# Patient Record
Sex: Female | Born: 1960 | Race: White | Hispanic: No | Marital: Married | Smoking: Never smoker
Health system: Southern US, Community
[De-identification: ages and names within clinical notes are randomized; demographics above are authoritative.]

## PROBLEM LIST (undated history)

## (undated) DIAGNOSIS — M1990 Unspecified osteoarthritis, unspecified site: Secondary | ICD-10-CM

## (undated) DIAGNOSIS — E039 Hypothyroidism, unspecified: Secondary | ICD-10-CM

## (undated) DIAGNOSIS — K709 Alcoholic liver disease, unspecified: Secondary | ICD-10-CM

## (undated) DIAGNOSIS — J45909 Unspecified asthma, uncomplicated: Secondary | ICD-10-CM

## (undated) HISTORY — PX: WRIST SURGERY: SHX841

## (undated) HISTORY — PX: APPENDECTOMY: SHX54

---

## 2013-06-09 HISTORY — PX: KNEE SURGERY: SHX244

## 2014-12-22 ENCOUNTER — Inpatient Hospital Stay (HOSPITAL_COMMUNITY)
Admission: EM | Admit: 2014-12-22 | Discharge: 2014-12-23 | DRG: 378 | Disposition: A | Discharge status: Rehabilitation Facility | Payer: BLUE CROSS/BLUE SHIELD | Attending: Internal Medicine | Admitting: Internal Medicine

## 2014-12-22 ENCOUNTER — Encounter (HOSPITAL_COMMUNITY): Payer: Self-pay | Admitting: Emergency Medicine

## 2014-12-22 ENCOUNTER — Encounter (HOSPITAL_COMMUNITY)
Admission: EM | Disposition: A | Discharge status: Rehabilitation Facility | Payer: Self-pay | Source: Home / Self Care | Attending: Internal Medicine

## 2014-12-22 DIAGNOSIS — K253 Acute gastric ulcer without hemorrhage or perforation: Secondary | ICD-10-CM | POA: Diagnosis not present

## 2014-12-22 DIAGNOSIS — Z79899 Other long term (current) drug therapy: Secondary | ICD-10-CM | POA: Diagnosis not present

## 2014-12-22 DIAGNOSIS — K766 Portal hypertension: Secondary | ICD-10-CM | POA: Diagnosis present

## 2014-12-22 DIAGNOSIS — K254 Chronic or unspecified gastric ulcer with hemorrhage: Secondary | ICD-10-CM | POA: Diagnosis present

## 2014-12-22 DIAGNOSIS — Z880 Allergy status to penicillin: Secondary | ICD-10-CM | POA: Diagnosis not present

## 2014-12-22 DIAGNOSIS — K7030 Alcoholic cirrhosis of liver without ascites: Secondary | ICD-10-CM | POA: Diagnosis present

## 2014-12-22 DIAGNOSIS — K3189 Other diseases of stomach and duodenum: Secondary | ICD-10-CM | POA: Diagnosis present

## 2014-12-22 DIAGNOSIS — J45909 Unspecified asthma, uncomplicated: Secondary | ICD-10-CM

## 2014-12-22 DIAGNOSIS — K922 Gastrointestinal hemorrhage, unspecified: Secondary | ICD-10-CM | POA: Diagnosis present

## 2014-12-22 DIAGNOSIS — D62 Acute posthemorrhagic anemia: Secondary | ICD-10-CM

## 2014-12-22 DIAGNOSIS — K259 Gastric ulcer, unspecified as acute or chronic, without hemorrhage or perforation: Secondary | ICD-10-CM

## 2014-12-22 DIAGNOSIS — K449 Diaphragmatic hernia without obstruction or gangrene: Secondary | ICD-10-CM | POA: Diagnosis present

## 2014-12-22 DIAGNOSIS — K920 Hematemesis: Secondary | ICD-10-CM | POA: Diagnosis present

## 2014-12-22 DIAGNOSIS — E039 Hypothyroidism, unspecified: Secondary | ICD-10-CM | POA: Diagnosis not present

## 2014-12-22 DIAGNOSIS — Z791 Long term (current) use of non-steroidal anti-inflammatories (NSAID): Secondary | ICD-10-CM

## 2014-12-22 DIAGNOSIS — M179 Osteoarthritis of knee, unspecified: Secondary | ICD-10-CM | POA: Diagnosis present

## 2014-12-22 DIAGNOSIS — Z888 Allergy status to other drugs, medicaments and biological substances status: Secondary | ICD-10-CM | POA: Diagnosis not present

## 2014-12-22 DIAGNOSIS — J4520 Mild intermittent asthma, uncomplicated: Secondary | ICD-10-CM

## 2014-12-22 DIAGNOSIS — M1711 Unilateral primary osteoarthritis, right knee: Secondary | ICD-10-CM

## 2014-12-22 HISTORY — DX: Unspecified asthma, uncomplicated: J45.909

## 2014-12-22 HISTORY — DX: Unspecified osteoarthritis, unspecified site: M19.90

## 2014-12-22 HISTORY — DX: Hypothyroidism, unspecified: E03.9

## 2014-12-22 HISTORY — PX: ESOPHAGOGASTRODUODENOSCOPY: SHX5428

## 2014-12-22 HISTORY — DX: Alcoholic liver disease, unspecified: K70.9

## 2014-12-22 LAB — URINALYSIS, ROUTINE W REFLEX MICROSCOPIC
Bilirubin Urine: NEGATIVE
GLUCOSE, UA: NEGATIVE mg/dL
HGB URINE DIPSTICK: NEGATIVE
KETONES UR: NEGATIVE mg/dL
LEUKOCYTES UA: NEGATIVE
NITRITE: NEGATIVE
Protein, ur: NEGATIVE mg/dL
SPECIFIC GRAVITY, URINE: 1.014 (ref 1.005–1.030)
Urobilinogen, UA: 0.2 mg/dL (ref 0.0–1.0)
pH: 6 (ref 5.0–8.0)

## 2014-12-22 LAB — COMPREHENSIVE METABOLIC PANEL
ALT: 46 U/L (ref 14–54)
AST: 127 U/L — ABNORMAL HIGH (ref 15–41)
Albumin: 2.8 g/dL — ABNORMAL LOW (ref 3.5–5.0)
Alkaline Phosphatase: 97 U/L (ref 38–126)
Anion gap: 4 — ABNORMAL LOW (ref 5–15)
BUN: 25 mg/dL — AB (ref 6–20)
CO2: 26 mmol/L (ref 22–32)
Calcium: 7.6 mg/dL — ABNORMAL LOW (ref 8.9–10.3)
Chloride: 108 mmol/L (ref 101–111)
Creatinine, Ser: 0.52 mg/dL (ref 0.44–1.00)
GFR calc Af Amer: >60 mL/min (ref >60)
GLUCOSE: 95 mg/dL (ref 65–99)
POTASSIUM: 4.1 mmol/L (ref 3.5–5.1)
Sodium: 138 mmol/L (ref 135–145)
TOTAL PROTEIN: 6 g/dL — AB (ref 6.5–8.1)
Total Bilirubin: 1.2 mg/dL (ref 0.3–1.2)

## 2014-12-22 LAB — HEMOGLOBIN AND HEMATOCRIT, BLOOD
HCT: 25.2 % — ABNORMAL LOW (ref 36.0–46.0)
HEMATOCRIT: 25.9 % — AB (ref 36.0–46.0)
HEMOGLOBIN: 8.5 g/dL — AB (ref 12.0–15.0)
Hemoglobin: 8.1 g/dL — ABNORMAL LOW (ref 12.0–15.0)

## 2014-12-22 LAB — CBC WITH DIFFERENTIAL/PLATELET
Basophils Absolute: 0 10*3/uL (ref 0.0–0.1)
Basophils Relative: 0 % (ref 0–1)
EOS PCT: 2 % (ref 0–5)
Eosinophils Absolute: 0.1 10*3/uL (ref 0.0–0.7)
HCT: 27 % — ABNORMAL LOW (ref 36.0–46.0)
HEMOGLOBIN: 9 g/dL — AB (ref 12.0–15.0)
LYMPHS ABS: 0.9 10*3/uL (ref 0.7–4.0)
Lymphocytes Relative: 20 % (ref 12–46)
MCH: 32.8 pg (ref 26.0–34.0)
MCHC: 33.3 g/dL (ref 30.0–36.0)
MCV: 98.5 fL (ref 78.0–100.0)
MONOS PCT: 12 % (ref 3–12)
Monocytes Absolute: 0.6 10*3/uL (ref 0.1–1.0)
NEUTROS ABS: 3 10*3/uL (ref 1.7–7.7)
Neutrophils Relative %: 66 % (ref 43–77)
Platelets: 162 10*3/uL (ref 150–400)
RBC: 2.74 MIL/uL — AB (ref 3.87–5.11)
RDW: 14.7 % (ref 11.5–15.5)
WBC: 4.6 10*3/uL (ref 4.0–10.5)

## 2014-12-22 LAB — MRSA PCR SCREENING: MRSA BY PCR: NEGATIVE

## 2014-12-22 LAB — PREPARE RBC (CROSSMATCH)

## 2014-12-22 LAB — PROTIME-INR
INR: 1.45 (ref 0.00–1.49)
Prothrombin Time: 17.7 seconds — ABNORMAL HIGH (ref 11.6–15.2)

## 2014-12-22 SURGERY — EGD (ESOPHAGOGASTRODUODENOSCOPY)
Anesthesia: Moderate Sedation

## 2014-12-22 MED ORDER — ALBUTEROL SULFATE (2.5 MG/3ML) 0.083% IN NEBU: 3.0000 mL | INHALATION_SOLUTION | Freq: Four times a day (QID) | RESPIRATORY_TRACT | Status: DC | PRN

## 2014-12-22 MED ORDER — DIPHENHYDRAMINE HCL 50 MG/ML IJ SOLN
INTRAMUSCULAR | Status: DC | PRN
  Administered 2014-12-22 (×2): 25 mg via INTRAVENOUS

## 2014-12-22 MED ORDER — DIPHENHYDRAMINE HCL 50 MG/ML IJ SOLN
INTRAMUSCULAR | Status: AC
  Filled 2014-12-22: qty 1

## 2014-12-22 MED ORDER — PANTOPRAZOLE SODIUM 40 MG IV SOLR
40.0000 mg | Freq: Two times a day (BID) | INTRAVENOUS | Status: DC
  Administered 2014-12-22 – 2014-12-23 (×3): 40 mg via INTRAVENOUS
  Filled 2014-12-22 (×4): qty 40

## 2014-12-22 MED ORDER — MIDAZOLAM HCL 5 MG/ML IJ SOLN
INTRAMUSCULAR | Status: AC
  Filled 2014-12-22: qty 2

## 2014-12-22 MED ORDER — SODIUM CHLORIDE 0.9 % IJ SOLN
3.0000 mL | Freq: Two times a day (BID) | INTRAMUSCULAR | Status: DC
  Administered 2014-12-22 (×2): 3 mL via INTRAVENOUS

## 2014-12-22 MED ORDER — MELATONIN 3 MG PO CAPS: 2.0000 | ORAL_CAPSULE | Freq: Every day | ORAL | Status: DC

## 2014-12-22 MED ORDER — FENTANYL CITRATE (PF) 100 MCG/2ML IJ SOLN
INTRAMUSCULAR | Status: DC | PRN
  Administered 2014-12-22: 25 ug via INTRAVENOUS

## 2014-12-22 MED ORDER — ACETAMINOPHEN 500 MG PO TABS: 1000.0000 mg | ORAL_TABLET | Freq: Four times a day (QID) | ORAL | Status: DC | PRN

## 2014-12-22 MED ORDER — SODIUM CHLORIDE 0.9 % IV SOLN: INTRAVENOUS | Status: DC

## 2014-12-22 MED ORDER — ADULT MULTIVITAMIN W/MINERALS CH
1.0000 | ORAL_TABLET | Freq: Every day | ORAL | Status: DC
  Administered 2014-12-23: 1 via ORAL
  Filled 2014-12-22: qty 1

## 2014-12-22 MED ORDER — LEVOTHYROXINE SODIUM 75 MCG PO TABS
75.0000 ug | ORAL_TABLET | Freq: Every day | ORAL | Status: DC
  Administered 2014-12-23: 75 ug via ORAL
  Filled 2014-12-22 (×2): qty 1

## 2014-12-22 MED ORDER — FENTANYL CITRATE (PF) 100 MCG/2ML IJ SOLN
INTRAMUSCULAR | Status: AC
  Filled 2014-12-22: qty 2

## 2014-12-22 MED ORDER — VITAMIN B-1 100 MG PO TABS
100.0000 mg | ORAL_TABLET | Freq: Every day | ORAL | Status: DC
  Administered 2014-12-23: 100 mg via ORAL
  Filled 2014-12-22: qty 1

## 2014-12-22 MED ORDER — SODIUM CHLORIDE 0.9 % IV BOLUS (SEPSIS)
1000.0000 mL | Freq: Once | INTRAVENOUS | Status: AC
  Administered 2014-12-22: 1000 mL via INTRAVENOUS

## 2014-12-22 MED ORDER — SODIUM CHLORIDE 0.9 % IV SOLN
80.0000 mg | Freq: Once | INTRAVENOUS | Status: AC
  Administered 2014-12-22: 80 mg via INTRAVENOUS
  Filled 2014-12-22: qty 80

## 2014-12-22 MED ORDER — SODIUM CHLORIDE 0.9 % IV SOLN
Freq: Once | INTRAVENOUS | Status: AC
  Administered 2014-12-22: 10:00:00 via INTRAVENOUS

## 2014-12-22 MED ORDER — KCL IN DEXTROSE-NACL 10-5-0.45 MEQ/L-%-% IV SOLN
INTRAVENOUS | Status: DC
  Administered 2014-12-22: 16:00:00 via INTRAVENOUS
  Filled 2014-12-22 (×4): qty 1000

## 2014-12-22 MED ORDER — BUTAMBEN-TETRACAINE-BENZOCAINE 2-2-14 % EX AERO
INHALATION_SPRAY | CUTANEOUS | Status: DC | PRN
  Administered 2014-12-22: 2 via TOPICAL

## 2014-12-22 MED ORDER — MIDAZOLAM HCL 10 MG/2ML IJ SOLN
INTRAMUSCULAR | Status: DC | PRN
  Administered 2014-12-22 (×2): 2 mg via INTRAVENOUS

## 2014-12-22 MED ORDER — ACETAMINOPHEN 325 MG PO TABS: 650.0000 mg | ORAL_TABLET | Freq: Four times a day (QID) | ORAL | Status: DC | PRN

## 2014-12-22 MED ORDER — ONDANSETRON HCL 4 MG/2ML IJ SOLN: 4.0000 mg | Freq: Four times a day (QID) | INTRAMUSCULAR | Status: DC | PRN

## 2014-12-22 MED ORDER — ONDANSETRON HCL 4 MG PO TABS: 4.0000 mg | ORAL_TABLET | Freq: Four times a day (QID) | ORAL | Status: DC | PRN

## 2014-12-22 NOTE — ED Notes (Signed)
MD at bedside. 

## 2014-12-22 NOTE — ED Notes (Signed)
Patient has a bed in [facility], hospitalist requests patient to stay in ED until seen by hospitalist.

## 2014-12-22 NOTE — Op Note (Signed)
[facility] [address] [place], [id number]   ENDOSCOPY PROCEDURE REPORT  PATIENT: Desiree Peters, Desiree Peters  MR#: [id number] BIRTHDATE: January 04, 1961 , 54  yrs. old GENDER: female ENDOSCOPIST: Vida RiggerMarc Almena Hokenson, MD REFERRED BY: PROCEDURE DATE:  12/22/2014 PROCEDURE:  EGD w/ biopsy ASA CLASS:     Class II INDICATIONS:  hematemesis. inpatient with history of cirrhosis and on Naprosyn MEDICATIONS: Benadryl 50 mg IV, Fentanyl 25 mcg IV, and Versed 4 mg IV TOPICAL ANESTHETIC: Cetacaine Spray  DESCRIPTION OF PROCEDURE: After the risks benefits and alternatives of the procedure were thoroughly explained, informed consent was obtained.  The Pentax Gastroscope [id number] endoscope was introduced through the mouth and advanced to the second portion of the duodenum , Without limitations.  The instrument was slowly withdrawn as the mucosa was fully examined. Estimated blood loss is zero unless otherwise noted in this procedure report.    the findings are recorded below       Retroflexed views revealed a hiatal hernia.     The scope was then withdrawn from the patient and the procedure completed.  COMPLICATIONS: There were no immediate complications.  ENDOSCOPIC IMPRESSION: 1. Small hiatal hernia 2. Probably very mild portal gastropathy3. Small antral ulcer with flat white base 4. Otherwise within normal limits EGD without signs of active bleeding status post antral and proximal gastric biopsy to rule out H. pylori  RECOMMENDATIONS: no aspirin or non-steroidal's no alcohol pump inhibitors for 2-3 months treat H. pylori if positive and follow-up with her primary gastroenterologist in [place] to consider repeat endoscopy to document healing and probably proceed with a screening colonoscopy at the same time  REPEAT EXAM: as needed  eSigned:  Vida RiggerMarc Breyton Vanscyoc, MD 12/22/2014 3:40 PM    CC:  CPT CODES: ICD CODES:  The ICD and CPT codes recommended by this software  are interpretations from the data that the clinical staff has captured with the software.  The verification of the translation of this report to the ICD and CPT codes and modifiers is the sole responsibility of the health care institution and practicing physician where this report was generated.  [employer] will not be held responsible for the validity of the ICD and CPT codes included on this report.  AMA assumes no liability for data contained or not contained herein. CPT is a Publishing rights managerregistered trademark of the [employer].  PATIENT NAME:  Desiree Peters, Desiree Peters MR#: [id number]

## 2014-12-22 NOTE — ED Notes (Signed)
Emesis at bedside. Bloody content noted

## 2014-12-22 NOTE — ED Notes (Addendum)
54 yo [facility]. C/O abdominal pain, HA, and Loose stools. States she could not rest last night. At 0630 vomited dark brown blood was given zofran and symptoms relieved. 250 bolus given in route.

## 2014-12-22 NOTE — Progress Notes (Signed)

## 2014-12-22 NOTE — Progress Notes (Addendum)
Pt without a pcp Spoke with her and she reports having seen her GI MD more than a pcp Reports last being seen by Cristal GenerousPhuong Batouli, PA  at [facility] [phone]  EPIC updated  Pt inquired about having ice chips or something in her mouth States she mentioned it to the GI consult MD who visited her in ED  ED Cm spoke with ED RN about pt request and updated pt ED RN would check for her  Noting pt's orders indicates NPO Cm discussed with pt that ED RN may be able to offer something to wet her tongue if not ice chips- No order for ice chips at this time

## 2014-12-22 NOTE — H&P (Signed)
[facility] History and Physical  Desiree Peters NGE:[id number] DOB: [birth year] DOA: 12/22/2014  Referring physician:  [name] PCP:  [name]   Chief Complaint:  hematemesis  HPI:  The patient is a 54 y.o. year-old female with history of EtOH cirrhosis, arthritis, hypothyroidism who presents with vomiting blood.  The patient was last at their baseline health except for some joint pains until the day prior to admission.  She states she developed some mild epigastric discomfort last night after dinner with some nausea.  Her discomfort increased pain and got progressively worse overnight.  She states that she had several bowel movements that were softer than usual but still formed and narrowing caliber overnight. She denied watery diarrhea, blood in her stools, black tarry stools.  This morning around 6:30 AM she developed severe nausea and started vomiting into a trash can. The people at her fellowship noticed that it was bloody emesis and they transported her to the emergency department immediately. She denies recent alcohol use. She states that her last alcohol use was a proximally 2 weeks ago. She has arthritis and was recently started back on Naprosyn 500 mg by mouth twice a day about one week ago. She denies any other NSAID use. She also recently started Fosamax. She did not have serious retching prior to developing bloody emesis. She has never had any problems with gastrointestinal bleeding before. She states that her cirrhosis is well managed by her gastroenterologist and she has been compliant with her Lasix, spironolactone. She has not needed lactulose and has not required a paracentesis and many years. She states that her gastroenterologist feels that her liver is "growing back".    In the ER, her VSS, hemoglobin decreased from 11 to 9 , BUN of 25 and a creatinine of 0.5 to.  GI was consulted and are planning upper endoscopy later today.   Review of Systems:  General:   Denies fevers, chills, weight loss or gain HEENT:  Denies changes to hearing and vision, rhinorrhea, sinus congestion, sore throat CV:  Denies chest pain and palpitations, lower extremity edema.  PULM:  Denies SOB, wheezing, cough.   GI:  Per history of present illness.   GU:  Denies dysuria, frequency, urgency ENDO:  Denies polyuria, polydipsia.   HEME:  Denies abnormal bruising LYMPH:  Denies lymphadenopathy.   MSK:  Denies arthralgias, myalgias.   DERM:  Denies skin rash or ulcer.   NEURO:  Denies focal numbness, weakness, slurred speech, confusion, facial droop.  PSYCH:  Denies anxiety and depression.    Past Medical History  Diagnosis Date  . 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 Asthma    Past Surgical History  Procedure Laterality Date  . Knee surgery Right 2015  . Appendectomy     Social History:  reports that she has never smoked. She does not have any smokeless tobacco history on file. She reports that she does not drink alcohol or use illicit drugs. 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 Penicillins Anaphylaxis  . Hydroxychloroquine Hives    History reviewed. No pertinent family history.   Prior to Admission medications   Medication Sig Start Date End Date Taking? Authorizing Provider  acetaminophen (TYLENOL) 500 MG tablet Take 1,000 mg by mouth every 6 (six) hours as needed for moderate pain or fever.   Yes Historical Provider, MD  albuterol (PROVENTIL HFA;VENTOLIN HFA) 108 (90 BASE) MCG/ACT inhaler Inhale 2 puffs into the lungs every 6 (  six) hours as needed for wheezing or shortness of breath.   Yes Historical Provider, MD  alendronate (FOSAMAX) 70 MG tablet Take 70 mg by mouth once a week. Take with a full glass of water on an empty stomach.   Yes Historical Provider, MD  alum & mag hydroxide-simeth (MAALOX PLUS) 400-400-40 MG/5ML suspension Take 10-20 mLs by mouth every 6 (six) hours as needed for indigestion.   Yes  Historical Provider, MD  benzocaine-menthol (CHLORAEPTIC) 6-10 MG lozenge Take 1 lozenge by mouth as needed for sore throat.   Yes Historical Provider, MD  BENZONATATE PO Take 2 capsules by mouth every 8 (eight) hours as needed (cough).   Yes Historical Provider, MD  cetirizine (ZYRTEC) 10 MG tablet Take 10 mg by mouth daily as needed for allergies.   Yes Historical Provider, MD  DIAZEPAM IJ Inject 2 mLs as directed as needed (seizures).    Yes Historical Provider, MD  dicyclomine (BENTYL) 20 MG tablet Take 20 mg by mouth 4 (four) times daily as needed for spasms (GI cramping for 10 days then stop).   Yes Historical Provider, MD  docusate sodium (COLACE) 100 MG capsule Take 100 mg by mouth daily as needed for mild constipation or moderate constipation.    Yes Historical Provider, MD  furosemide (LASIX) 40 MG tablet Take 40 mg by mouth daily.   Yes Historical Provider, MD  guaiFENesin (MUCINEX) 600 MG 12 hr tablet Take 600 mg by mouth 2 (two) times daily as needed for cough (congestion).   Yes Historical Provider, MD  loperamide (IMODIUM) 2 MG capsule Take 4 mg by mouth as needed for diarrhea or loose stools (max 8 tabs a day).   Yes Historical Provider, MD  Melatonin 3 MG CAPS Take 2 capsules by mouth at bedtime.   Yes Historical Provider, MD  methocarbamol (ROBAXIN) 500 MG tablet Take 1,000 mg by mouth 4 (four) times daily as needed for muscle spasms (for 10 days then stop).   Yes Historical Provider, MD  mometasone (NASONEX) 50 MCG/ACT nasal spray Place 2 sprays into the nose daily as needed (allergies).    Yes Historical Provider, MD  Multiple Vitamins-Minerals (MULTIVITAMIN WITH MINERALS) tablet Take 1 tablet by mouth daily.   Yes Historical Provider, MD  naproxen (NAPROSYN) 500 MG tablet Take 500 mg by mouth 2 (two) times daily with a meal.   Yes Historical Provider, MD  ondansetron (ZOFRAN) 8 MG tablet Take 8 mg by mouth 2 (two) times daily as needed for nausea or vomiting.   Yes Historical  Provider, MD  polyethylene glycol (MIRALAX / GLYCOLAX) packet Take 17 g by mouth daily as needed for moderate constipation.   Yes Historical Provider, MD   Physical Exam: Filed Vitals:   12/22/14 0909 12/22/14 1212 12/22/14 1239  BP: 109/63 103/66 89/62  Pulse: 77 79 74  Temp: 98.1 F (36.7 C)    TempSrc: Oral    Resp: 16 14 16   SpO2: 100% 100% 100%     General:  Adult female, well appearing, no acute distress  Eyes:  PERRL, anicteric, non-injected.  ENT:  Nares clear.  OP clear, non-erythematous without plaques or exudates.  MMM.  Neck:  Supple without TM or JVD.    Lymph:  No cervical, supraclavicular, or submandibular LAD.  Cardiovascular:  RRR, normal S1, S2, without m/r/g.  2+ pulses, warm extremities  Respiratory:  CTA bilaterally without increased WOB.  Abdomen:  NABS.  Soft, ND, minimal TTP in epigastric area without rebound or  guarding  Skin:  No rashes or focal lesions.  Musculoskeletal:  Normal bulk and tone.  No LE edema.  Psychiatric:  A & O x 4.  Appropriate affect.  Neurologic:  CN 3-12 intact.  5/5 strength.  Sensation intact.  Labs on Admission:  Basic Metabolic Panel:  Recent Labs Lab 12/22/14 0938  NA 138  K 4.1  CL 108  CO2 26  GLUCOSE 95  BUN 25*  CREATININE 0.52  CALCIUM 7.6*   Liver Function Tests:  Recent Labs Lab 12/22/14 0938  AST 127*  ALT 46  ALKPHOS 97  BILITOT 1.2  PROT 6.0*  ALBUMIN 2.8*   No results for input(s): LIPASE, AMYLASE in the last 168 hours. No results for input(s): AMMONIA in the last 168 hours. CBC:  Recent Labs Lab 12/22/14 0938  WBC 4.6  NEUTROABS 3.0  HGB 9.0*  HCT 27.0*  MCV 98.5  PLT 162   Cardiac Enzymes: No results for input(s): CKTOTAL, CKMB, CKMBINDEX, TROPONINI in the last 168 hours.  BNP (last 3 results) No results for input(s): BNP in the last 8760 hours.  ProBNP (last 3 results) No results for input(s): PROBNP in the last 8760 hours.  CBG: No results for input(s): GLUCAP  in the last 168 hours.  Radiological Exams on Admission: No results found.  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  Mallory weiss tear possible.  Variceal bleed less likely since hemodynamically stable. -  PPI BID -  NPO -  GI consult pending -  q6h H&H -  Type and screen already obtained -  Transfuse for hemoglobin < 7 or symptomatic anemia -  Avoid NSAIDS and heparin/anticoagulation products  EtOH cirrhosis with hx of ascites -  plt count and INR are wnl -  Hold lasix  -  Not on spironolactone -  Finished alcohol withdrawal 1.5 weeks ago  Asthma, stable, continue prn albuterol  Hypothyroidism, stable, continue synthroid 75mcg  Osteoarthritis, stable, continue tylenol prn  Diet:  NPO Access:  2 PIV IVF:  yes Proph:  SCDs  Code Status: full Family Communication: patient alone Disposition Plan: Admit to stepdown  Time spent: 60 min Renae FickleSHORT, Xariah Silvernail [facility] Pager [phone]  If 7PM-7AM, please contact night-coverage [email or web address] Password [email or web address] 12/22/2014, 12:51 PM

## 2014-12-22 NOTE — ED Provider Notes (Signed)
CSN: [id number]     Arrival date & time 12/22/14  0848 History   First MD Initiated Contact with Patient 12/22/14 [phone]     Chief Complaint  Patient presents with  . Hematemesis     The history is provided by the patient. No language interpreter was used.   Desiree Peters presents for evaluation of hematemesis. [facility]. Last night she had For dinner and developed diffuse abdominal cramping followed by diarrhea. She reports soft stools. At 6:30 this morning she developed nausea and vomited a large amount of dark red blood. She currently denies any abdominal pain. She denies any black or bloody stools. She feels generalized weakness and fatigue. She states she could not sleep last night because she just did not feel well. She states multiple other people in [facility] also felt poorly after eating For dinner last night. She has a history of cirrhosis but no history of peptic ulcer disease or esophageal varices. She states she had endoscopy several years ago.  Past Medical History  Diagnosis Date  . 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 Asthma    Past Surgical History  Procedure Laterality Date  . Knee surgery Right 2015  . Appendectomy     No family history on file. History  Substance Use Topics  . Smoking status: Never Smoker   . Smokeless tobacco: Not on file  . Alcohol Use: No     Comment: Last drink 2 days   OB History    No data available     Review of Systems  All other systems reviewed and are negative.     Allergies  Penicillins  Home Medications   Prior to Admission medications   Not on File   There were no vitals taken for this visit. Physical Exam  Constitutional: She is oriented to person, place, and time. She appears well-developed and well-nourished.  HENT:  Head: Normocephalic and atraumatic.  Cardiovascular: Normal rate and regular rhythm.   No murmur heard. Pulmonary/Chest: Effort normal and breath  sounds normal. No respiratory distress.  Abdominal: Soft. There is no tenderness. There is no rebound and no guarding.  Musculoskeletal: She exhibits no edema or tenderness.  Neurological: She is alert and oriented to person, place, and time.  Skin: Skin is warm and dry.  Psychiatric: She has a normal mood and affect. Her behavior is normal.  Nursing note and vitals reviewed.   ED Course  Procedures (including critical care time) Labs Review Labs Reviewed  COMPREHENSIVE METABOLIC PANEL - Abnormal; Notable for the following:    BUN 25 (*)    Calcium 7.6 (*)    Total Protein 6.0 (*)    Albumin 2.8 (*)    AST 127 (*)    Anion gap 4 (*)    All other components within normal limits  CBC WITH DIFFERENTIAL/PLATELET - Abnormal; Notable for the following:    RBC 2.74 (*)    Hemoglobin 9.0 (*)    HCT 27.0 (*)    All other components within normal limits  PROTIME-INR - Abnormal; Notable for the following:    Prothrombin Time 17.7 (*)    All other components within normal limits  HEMOGLOBIN AND HEMATOCRIT, BLOOD - Abnormal; Notable for the following:    Hemoglobin 8.5 (*)    HCT 25.9 (*)    All other components within normal limits  MRSA PCR SCREENING  URINALYSIS, ROUTINE W REFLEX MICROSCOPIC (NOT AT [facility])  HEMOGLOBIN AND HEMATOCRIT, BLOOD  HEMOGLOBIN AND HEMATOCRIT, BLOOD  TYPE AND SCREEN  PREPARE RBC (CROSSMATCH)  SURGICAL PATHOLOGY    Imaging Review No results found.   EKG Interpretation None      MDM   Final diagnoses:  Acute upper GI bleed     [facility], Hgb 11.7, Plt 133 on 12/10/14  Patient with history of cirrhosis, recently started on naproxen twice a day here with hematemesis. Hemoglobin has dropped from prior on July 3 but priors in the system at [facility] year ago were similar. Patient did have prior endoscopy that demonstrated no evidence of esophageal varices were peptic ulcer disease per patient, records are not available. Patient  without recurrent vomiting in the emergency department. Discussed with gastroenterology who will see the patient in consult. Discussed with the hospitalist regarding admission for further management. Patient updated of findings of studies and need for admission for further testing.  Tilden FossaElizabeth Aneli Zara, MD 12/22/14 [phone]

## 2014-12-22 NOTE — Consult Note (Signed)
Reason for Consult: Upper GI bleeding Referring Physician: ER physician  Desiree Peters is an 54 y.o. female.  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 followed in [place] who had gone 11 months without drinking but with a relapse she entered a rehabilitation program 2 weeks ago and unfortunately has been taking Naprosyn for knee pain and did have some diarrhea yesterday which was not black but today threw up some blood and we are asked to see her and she had an endoscopy about 2 years ago without any obvious problems but has never had a colonoscopy and has been feeling well and exercising and not having any GI symptoms until yesterday and her family history is negative for any GI or liver problems and she has no other complaints  Past Medical History  Diagnosis Date  . 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 Asthma     Past Surgical History  Procedure Laterality Date  . Knee surgery Right 2015  . Appendectomy      History reviewed. No pertinent family history.  Social History:  reports that she has never smoked. She does not have any smokeless tobacco history on file. She reports that she does not drink alcohol or use illicit drugs.  Allergies:  Allergies  Allergen Reactions  . Penicillins Anaphylaxis  . Hydroxychloroquine Hives    Medications: I have reviewed the patient's current medications.  Results for orders placed or performed during the hospital encounter of 12/22/14 (from the past 48 hour(s))  Urinalysis, Routine w reflex microscopic (not at [facility])     Status: None   Collection Time: 12/22/14  9:20 AM  Result Value Ref Range   Color, Urine YELLOW YELLOW   APPearance CLEAR CLEAR   Specific Gravity, Urine 1.014 1.005 - 1.030   pH 6.0 5.0 - 8.0   Glucose, UA NEGATIVE NEGATIVE mg/dL   Hgb urine dipstick NEGATIVE NEGATIVE   Bilirubin Urine NEGATIVE NEGATIVE   Ketones, ur NEGATIVE NEGATIVE mg/dL   Protein, ur NEGATIVE NEGATIVE mg/dL   Urobilinogen, UA 0.2 0.0 - 1.0 mg/dL   Nitrite NEGATIVE NEGATIVE   Leukocytes, UA NEGATIVE NEGATIVE    Comment: MICROSCOPIC NOT DONE ON URINES WITH NEGATIVE PROTEIN, BLOOD, LEUKOCYTES, NITRITE, OR GLUCOSE <1000 mg/dL.  Comprehensive metabolic panel     Status: Abnormal   Collection Time: 12/22/14  9:38 AM  Result Value Ref Range   Sodium 138 135 - 145 mmol/L    Comment: REPEATED TO VERIFY   Potassium 4.1 3.5 - 5.1 mmol/L   Chloride 108 101 - 111 mmol/L    Comment: REPEATED TO VERIFY   CO2 26 22 - 32 mmol/L    Comment: REPEATED TO VERIFY   Glucose, Bld 95 65 - 99 mg/dL   BUN 25 (H) 6 - 20 mg/dL   Creatinine, Ser 0.52 0.44 - 1.00 mg/dL   Calcium 7.6 (L) 8.9 - 10.3 mg/dL   Total Protein 6.0 (L) 6.5 - 8.1 g/dL   Albumin 2.8 (L) 3.5 - 5.0 g/dL   AST 127 (H) 15 - 41 U/L   ALT 46 14 - 54 U/L   Alkaline Phosphatase 97 38 - 126 U/L   Total Bilirubin 1.2 0.3 - 1.2 mg/dL   GFR calc non Af Amer >60 >60 mL/min   GFR calc Af Amer >60 >60 mL/min    Comment: (NOTE) The eGFR has been calculated using the CKD EPI equation. This calculation has not been validated in all clinical situations. eGFR's persistently <  60 mL/min signify possible Chronic Kidney Disease.    Anion gap 4 (L) 5 - 15    Comment: REPEATED TO VERIFY  CBC with Differential     Status: Abnormal   Collection Time: 12/22/14  9:38 AM  Result Value Ref Range   WBC 4.6 4.0 - 10.5 K/uL   RBC 2.74 (L) 3.87 - 5.11 MIL/uL   Hemoglobin 9.0 (L) 12.0 - 15.0 g/dL   HCT 27.0 (L) 36.0 - 46.0 %   MCV 98.5 78.0 - 100.0 fL   MCH 32.8 26.0 - 34.0 pg   MCHC 33.3 30.0 - 36.0 g/dL   RDW 14.7 11.5 - 15.5 %   Platelets 162 150 - 400 K/uL   Neutrophils Relative % 66 43 - 77 %   Neutro Abs 3.0 1.7 - 7.7 K/uL   Lymphocytes Relative 20 12 - 46 %   Lymphs Abs 0.9 0.7 - 4.0 K/uL   Monocytes Relative 12 3 - 12 %   Monocytes Absolute 0.6 0.1 - 1.0 K/uL   Eosinophils Relative 2 0 - 5 %   Eosinophils Absolute 0.1 0.0 - 0.7 K/uL   Basophils Relative  0 0 - 1 %   Basophils Absolute 0.0 0.0 - 0.1 K/uL  Protime-INR     Status: Abnormal   Collection Time: 12/22/14  9:38 AM  Result Value Ref Range   Prothrombin Time 17.7 (H) 11.6 - 15.2 seconds   INR 1.45 0.00 - 1.49  Type and screen     Status: None (Preliminary result)   Collection Time: 12/22/14  9:38 AM  Result Value Ref Range   ABO/RH(D) A POS    Antibody Screen NEG    Sample Expiration 12/25/2014    Unit Number [id number]    Blood Component Type RED CELLS,LR    Unit division 00    Status of Unit ALLOCATED    Transfusion Status PENDING    Crossmatch Result PENDING    Unit Number [id number]    Blood Component Type RED CELLS,LR    Unit division 00    Status of Unit ALLOCATED    Transfusion Status PENDING    Crossmatch Result PENDING   Prepare RBC     Status: None   Collection Time: 12/22/14 10:10 AM  Result Value Ref Range   Order Confirmation ORDER PROCESSED BY BLOOD BANK     No results found.  ROS negative except above her ascites has been well controlled with stopping drinking and diureretic's and she sees her gastroenterologist frequently Blood pressure 109/63, pulse 77, temperature 98.1 F (36.7 C), temperature source Oral, resp. rate 16, SpO2 100 %. Physical Exam vital signs stable afebrile no acute distress exam please see preassessment evaluation labs reviewed  Assessment/Plan: Upper GI bleeding in a patient with alcoholic cirrhosis on Naprosyn Plan: The risks benefits methods of endoscopy was discussed and will proceed later today with further workup and plans pending those findings  [place] E 12/22/2014, 11:28 AM

## 2014-12-22 NOTE — ED Notes (Signed)
Bed: WA03 Expected date:  Expected time:  Means of arrival:  Comments: 54 yo vomiting blood

## 2014-12-23 DIAGNOSIS — K3189 Other diseases of stomach and duodenum: Secondary | ICD-10-CM

## 2014-12-23 DIAGNOSIS — K766 Portal hypertension: Secondary | ICD-10-CM

## 2014-12-23 DIAGNOSIS — K259 Gastric ulcer, unspecified as acute or chronic, without hemorrhage or perforation: Secondary | ICD-10-CM

## 2014-12-23 DIAGNOSIS — K253 Acute gastric ulcer without hemorrhage or perforation: Secondary | ICD-10-CM

## 2014-12-23 LAB — COMPREHENSIVE METABOLIC PANEL
ALT: 38 U/L (ref 14–54)
AST: 101 U/L — AB (ref 15–41)
Albumin: 2.3 g/dL — ABNORMAL LOW (ref 3.5–5.0)
Alkaline Phosphatase: 76 U/L (ref 38–126)
Anion gap: 6 (ref 5–15)
BUN: 11 mg/dL (ref 6–20)
CALCIUM: 7.6 mg/dL — AB (ref 8.9–10.3)
CHLORIDE: 114 mmol/L — AB (ref 101–111)
CO2: 23 mmol/L (ref 22–32)
CREATININE: 0.64 mg/dL (ref 0.44–1.00)
Glucose, Bld: 100 mg/dL — ABNORMAL HIGH (ref 65–99)
Potassium: 3.9 mmol/L (ref 3.5–5.1)
Sodium: 143 mmol/L (ref 135–145)
TOTAL PROTEIN: 5 g/dL — AB (ref 6.5–8.1)
Total Bilirubin: 0.9 mg/dL (ref 0.3–1.2)

## 2014-12-23 LAB — CBC
HCT: 23.3 % — ABNORMAL LOW (ref 36.0–46.0)
HEMOGLOBIN: 7.6 g/dL — AB (ref 12.0–15.0)
MCH: 32.6 pg (ref 26.0–34.0)
MCHC: 32.6 g/dL (ref 30.0–36.0)
MCV: 100 fL (ref 78.0–100.0)
Platelets: 143 10*3/uL — ABNORMAL LOW (ref 150–400)
RBC: 2.33 MIL/uL — AB (ref 3.87–5.11)
RDW: 15 % (ref 11.5–15.5)
WBC: 3.3 10*3/uL — ABNORMAL LOW (ref 4.0–10.5)

## 2014-12-23 LAB — ABO/RH: ABO/RH(D): A POS

## 2014-12-23 MED ORDER — FERROUS SULFATE 325 (65 FE) MG PO TBEC: 325.0000 mg | DELAYED_RELEASE_TABLET | Freq: Three times a day (TID) | ORAL | Status: AC

## 2014-12-23 MED ORDER — LEVOTHYROXINE SODIUM 75 MCG PO TABS: 75.0000 ug | ORAL_TABLET | Freq: Every day | ORAL | Status: AC

## 2014-12-23 MED ORDER — OMEPRAZOLE 40 MG PO CPDR: 40.0000 mg | DELAYED_RELEASE_CAPSULE | Freq: Every day | ORAL | Status: AC

## 2014-12-23 NOTE — Plan of Care (Signed)
Problem: Discharge Progression Outcomes Goal: Hemodynamically stable Outcome: Progressing Pt. To take ferrous sulfate prescription given. Goal: Other Discharge Outcomes/Goals Outcome: Completed/Met Date Met:  12/23/14 Pt. To return to to [facility] for Alcohol treatment.

## 2014-12-23 NOTE — Progress Notes (Signed)
[facility] DoveJenny Devalle. Left via wheelchair and no respiratory distress noted. All paperwork and prescriptions with pt.

## 2014-12-23 NOTE — Clinical Social Work Note (Signed)
CSW received call to assist RN with [facility]  CSW spoke with facility to inquire what they needed and faxed all paperwork over  Facility stated that they would call RN with a decision  Please call CSW for assistance if needed  .Elray Bubaegina Chriselda Leppert, LCSW [facility] Clinical Social Worker - Weekend Coverage cell #: [phone]

## 2014-12-23 NOTE — Discharge Summary (Signed)
Physician Discharge Summary  Desiree Peters ZOX:[id number] DOB: Mar 23, 1961 DOA: 12/22/2014  PCP: Desiree Peters  Admit date: 12/22/2014 Discharge date: 12/23/2014  Recommendations for Outpatient Follow-up:  F/u with Dr. Stevphen Meuse, [phone],  to review H. Pylori results F/u with orthopedics provider to continue injections to knee to minimize need for NSAID pain medication  Discharge Diagnoses:  Principal Problem:   Antral ulcer Active Problems:   Acute upper GI bleed   Acute blood loss anemia   Asthma, chronic   Hypothyroidism   Osteoarthritis of right knee   Portal hypertensive gastropathy   Discharge Condition: stable, improved  Diet recommendation:  Low sodium  Wt Readings from Last 3 Encounters:  12/22/14 65.772 kg (145 lb)    History of present illness:   The patient is a 54 y.o. year-old female with history of EtOH cirrhosis, arthritis, hypothyroidism who presented with vomiting blood. She denied recent alcohol use but had recently started back on Naprosyn 500 mg by mouth twice a day about one week prior to admission.  In the ER, her VSS, hemoglobin decreased from 11 to 9 , BUN of 25 and a creatinine of 0.5 to. GI was consulted.    Hospital Course:   Hematemesis with acute blood loss anemia secondary to mild portal gastropathy and a small antral ulcer without evidence of active bleeding on upper endoscopy performed on 7/15 by Dr. Ewing Schlein.  She had no further hematemesis or blood in her stools after admission. She did not require blood transfusion and her hemoglobin trended down to 7.6 mg per deciliter.  She was started on iron supplementation. She was advised not to use NSAIDs or drink alcohol. She had several biopsies taken during her EGD that are being tested for H. pylori, the results of which will not be back for several more days. She is advised to follow-up with her asked her to urologist to review these test results. She should continue EPI for 2-3 months and  our gastroenterologist recommended a repeat endoscopy to document healing. Could consider screening colonoscopy at the same time.  EtOH cirrhosis with hx of ascites. Her INR blood count within normal limits. Because of her hematemesis, her diarrhetic's were held but were resumed at the time of discharge. 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  Asthma, stable, continued prn albuterol  Hypothyroidism, stable, continued synthroid  Osteoarthritis, stable, continued tylenol prn, advised to use judiciously secondary to cirrhosis. She should follow-up with her orthopedic surgeon to continue injections of her knees that she can minimize her NSAID use.  Procedures:  EGD on 7/15  Consultations:  Gastroenterology, Dr.Magod  Discharge Exam: Filed Vitals:   12/23/14 0509  BP: 97/59  Pulse: 69  Temp: 98.3 F (36.8 C)  Resp: 16   Filed Vitals:   12/22/14 1945 12/22/14 2000 12/22/14 2059 12/23/14 0509  BP:  118/57 107/64 97/59  Pulse:  69 71 69  Temp: 98.7 F (37.1 C)  98.6 F (37 C) 98.3 F (36.8 C)  TempSrc: Oral  Oral Oral  Resp:  Height:    (1.778 m)   Weight:   65.772 kg (145 lb)   SpO2:  100% 100% 98%    General: Thin female, no acute distress Cardiovascular: Regular rate and rhythm, no murmurs rubs or gallops Respiratory: Clear to auscultation bilaterally Abdomen: NABS, soft, nondistended, nontender MSK: Normal tone  and bulk, no lower extremity edema  Discharge Instructions      Discharge Instructions    Call MD for:  difficulty breathing, headache or visual disturbances    Complete by:  As directed      Call MD for:  extreme fatigue    Complete by:  As directed      Call MD for:  hives    Complete by:  As directed      Call MD for:  persistant dizziness or light-headedness    Complete by:  As directed      Call MD for:   persistant nausea and vomiting    Complete by:  As directed      Call MD for:  severe uncontrolled pain    Complete by:  As directed      Call MD for:  temperature >100.4    Complete by:  As directed      Diet - low sodium heart healthy    Complete by:  As directed      Discharge instructions    Complete by:  As directed   You had a small ulcer and some irritation of your stomach which caused bleeding.  Stop taking your naprosyn, ibuprofen, and any other over the counter pain medications unless your doctor states it is okay.  Talk to your orthopedics provider about additional knee injections.  Consider wearing a brace when exercising.  Talk to your orthopedics doctor about diclofenac gel.  In the meantime, you can use rubs like icy hot or menthol creams to help ease the pain.  Please start taking omeprazole once daily and use iron tabs to rebuild your blood stores.  You will notice that iron may cause some constipation and turn your stools dark black.   Please return to the hospital if you have further bleeding.     Increase activity slowly    Complete by:  As directed             Medication List    STOP taking these medications        alum & mag hydroxide-simeth 400-400-40 MG/5ML suspension  Commonly known as:  MAALOX PLUS     benzocaine-menthol 6-10 MG lozenge  Commonly known as:  CHLORAEPTIC     BENZONATATE PO     DIAZEPAM IJ     dicyclomine 20 MG tablet  Commonly known as:  BENTYL     guaiFENesin 600 MG 12 hr tablet  Commonly known as:  MUCINEX     loperamide 2 MG capsule  Commonly known as:  IMODIUM     methocarbamol 500 MG tablet  Commonly known as:  ROBAXIN     naproxen 500 MG tablet  Commonly known as:  NAPROSYN     polyethylene glycol packet  Commonly known as:  MIRALAX / GLYCOLAX      TAKE these medications        acetaminophen 500 MG tablet  Commonly known as:  TYLENOL  Take 1,000 mg by mouth every 6 (six) hours as needed for moderate pain or fever.      albuterol 108 (90 BASE) MCG/ACT inhaler  Commonly known as:  PROVENTIL HFA;VENTOLIN HFA  Inhale 2 puffs into the lungs every 6 (six) hours as needed for wheezing or shortness of breath.     alendronate 70 MG tablet  Commonly known as:  FOSAMAX  Take 70 mg by mouth once a week. Take with a full glass of water on an empty stomach.  cetirizine 10 MG tablet  Commonly known as:  ZYRTEC  Take 10 mg by mouth daily as needed for allergies.     docusate sodium 100 MG capsule  Commonly known as:  COLACE  Take 100 mg by mouth daily as needed for mild constipation or moderate constipation.     ferrous sulfate 325 (65 FE) MG EC tablet  Take 1 tablet (325 mg total) by mouth 3 (three) times daily with meals.     furosemide 40 MG tablet  Commonly known as:  LASIX  Take 40 mg by mouth daily.     levothyroxine 75 MCG tablet  Commonly known as:  SYNTHROID, LEVOTHROID  Take 1 tablet (75 mcg total) by mouth daily before breakfast.     Melatonin 3 MG Caps  Take 2 capsules by mouth at bedtime.     mometasone 50 MCG/ACT nasal spray  Commonly known as:  NASONEX  Place 2 sprays into the nose daily as needed (allergies).     multivitamin with minerals tablet  Take 1 tablet by mouth daily.     omeprazole 40 MG capsule  Commonly known as:  PRILOSEC  Take 1 capsule (40 mg total) by mouth daily.     ondansetron 8 MG tablet  Commonly known as:  ZOFRAN  Take 8 mg by mouth 2 (two) times daily as needed for nausea or vomiting.       Follow-up Information    Follow up with [facility].   Specialty:  Family Medicine   Contact information:   [address] [place] [address] [phone]       Follow up with [facility], Nash Dimmer. Schedule an appointment as soon as possible for a visit in 2 weeks.   Contact information:   [phone]) [phone]       The results of significant diagnostics from this hospitalization (including imaging, microbiology, ancillary and laboratory) are  listed below for reference.    Significant Diagnostic Studies: No results found.  Microbiology: Recent Results (from the past 240 hour(s))  MRSA PCR Screening     Status: None   Collection Time: 12/22/14 12:44 PM  Result Value Ref Range Status   MRSA by PCR NEGATIVE NEGATIVE Final    Comment:        The GeneXpert MRSA Assay (FDA approved for NASAL specimens only), is one component of a comprehensive MRSA colonization surveillance program. It is not intended to diagnose MRSA infection nor to guide or monitor treatment for MRSA infections.      Labs: Basic Metabolic Panel:  Recent Labs Lab 12/22/14 0938 12/23/14 0535  NA 138 143  K 4.1 3.9  CL 108 114*  CO2 26 23  GLUCOSE 95 100*  BUN 25* 11  CREATININE 0.52 0.64  CALCIUM 7.6* 7.6*   Liver Function Tests:  Recent Labs Lab 12/22/14 0938 12/23/14 0535  AST 127* 101*  ALT 46 38  ALKPHOS 97 76  BILITOT 1.2 0.9  PROT 6.0* 5.0*  ALBUMIN 2.8* 2.3*   No results for input(s): LIPASE, AMYLASE in the last 168 hours. No results for input(s): AMMONIA in the last 168 hours. CBC:  Recent Labs Lab 12/22/14 0938 12/22/14 1352 12/22/14 1840 12/23/14 0535  WBC 4.6  --   --  3.3*  NEUTROABS 3.0  --   --   --   HGB 9.0* 8.5* 8.1* 7.6*  HCT 27.0* 25.9* 25.2* 23.3*  MCV 98.5  --   --  100.0  PLT  162  --   --  143*   Cardiac Enzymes: No results for input(s): CKTOTAL, CKMB, CKMBINDEX, TROPONINI in the last 168 hours. BNP: BNP (last 3 results) No results for input(s): BNP in the last 8760 hours.  ProBNP (last 3 results) No results for input(s): PROBNP in the last 8760 hours.  CBG: No results for input(s): GLUCAP in the last 168 hours.  Time coordinating discharge: 35 minutes  Signed:  Treon Kehl  [facility] 12/23/2014, 8:13 AM

## 2014-12-23 NOTE — Progress Notes (Signed)
Desiree FiremanJennifer Peters 9:37 AM  Subjective: Patient without problems from her endoscopy and no signs of further bleeding and we rediscussed her results  Objective: Vital signs stable afebrile no acute distress hemoglobin drop some with hydration abdomen is soft nontender  Assessment: Gastric ulcer  Plan: Agree with [place] biopsy results recommended no aspirin or non-steroids and no more than 4 Tylenol a day and probable follow-up endoscopy in 2-3 months to document healing and screening colonoscopy at the same time and continue once a day pump inhibitor until then  [facility],Desiree Peters Desiree  Pager [phone] After 5PM or if no answer call [phone]

## 2014-12-23 NOTE — Progress Notes (Signed)
Discharge teaching completed with teach back. Prescriptions given : Ferrous Sulfate, Levothyroxine, Omeprazole. Discharge instructions given and reviewed with pt. Handout on Peptic Ulcers. Pt. Understands to call for follow up appointments. [facility]. Social Work working on Designer, television/film setarrangements.

## 2014-12-25 ENCOUNTER — Encounter (HOSPITAL_COMMUNITY): Payer: Self-pay | Admitting: Gastroenterology

## 2014-12-25 LAB — TYPE AND SCREEN
ABO/RH(D): A POS
Antibody Screen: NEGATIVE
Unit division: 0
Unit division: 0

## 2015-01-01 ENCOUNTER — Ambulatory Visit
Admission: RE | Admit: 2015-01-01 | Discharge: 2015-01-01 | Disposition: A | Discharge status: Home / Self Care | Payer: BLUE CROSS/BLUE SHIELD | Source: Ambulatory Visit | Attending: *Deleted | Admitting: *Deleted

## 2015-01-01 ENCOUNTER — Other Ambulatory Visit: Payer: Self-pay | Admitting: *Deleted

## 2015-01-01 DIAGNOSIS — R609 Edema, unspecified: Secondary | ICD-10-CM

## 2015-08-08 DEATH — deceased

## 2016-11-23 IMAGING — US US EXTREM LOW VENOUS*L*
1 series · 13 of 24 positions shown · non-contrast
Comparison: None.

CLINICAL DATA: Left lower extremity pain and edema for past 6
months. Evaluate for DVT.



[Series 1: us extrem low venous*left* · 13 of 32 slices shown]
[im 1/32]
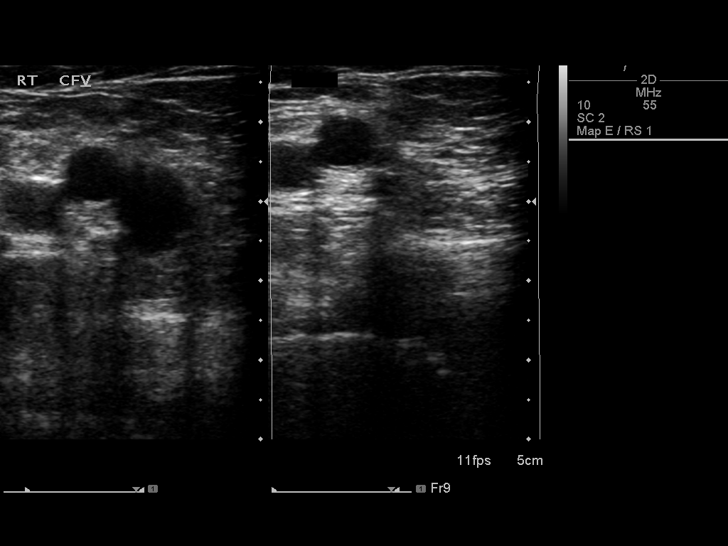
[im 3/32]
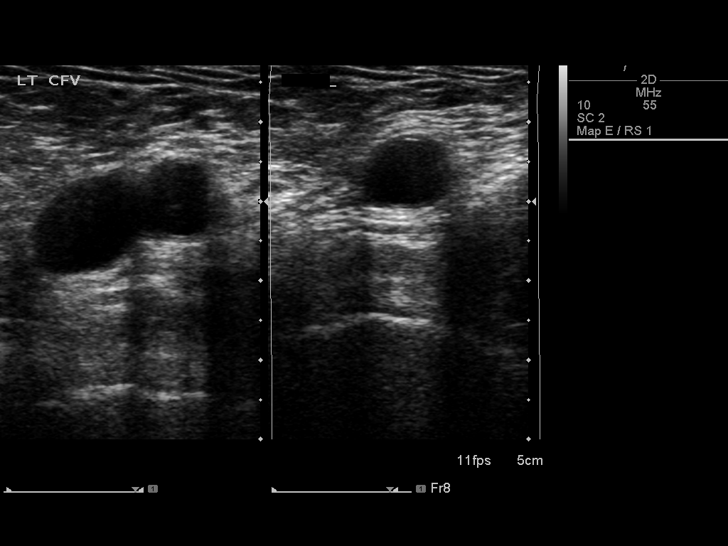
[im 6/32]
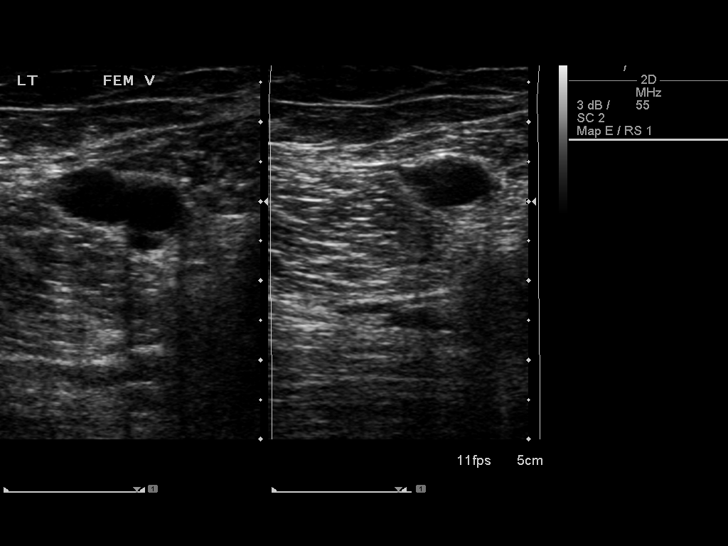
[im 9/32]
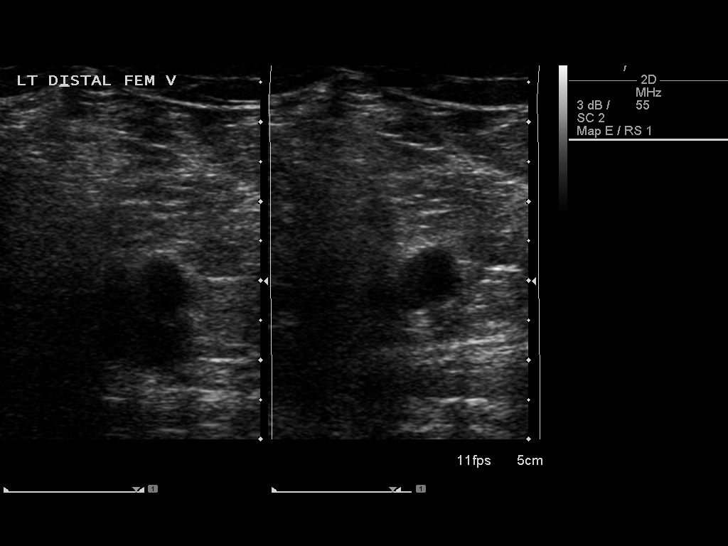
[im 11/32]
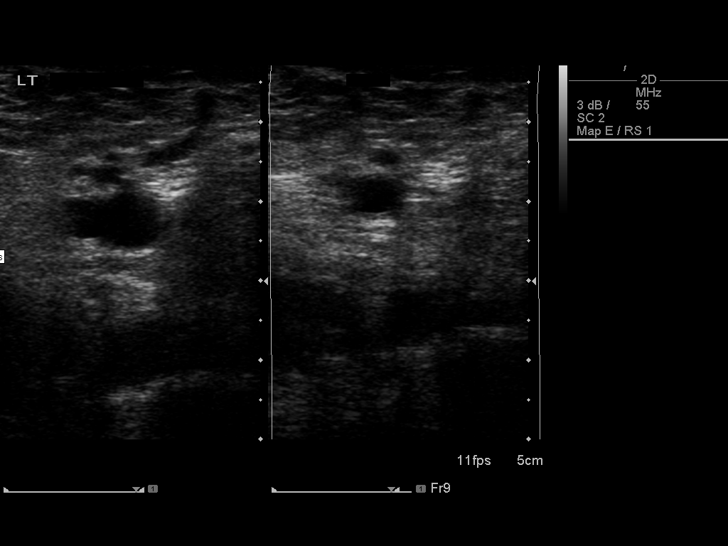
[im 14/32]
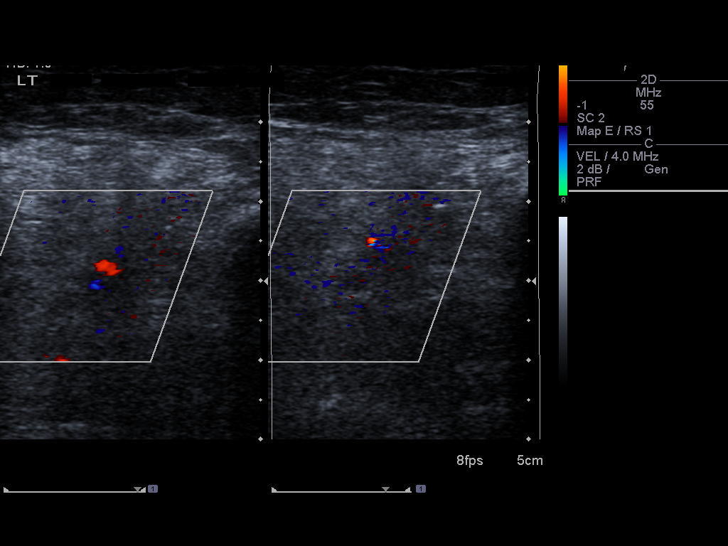
[im 17/32]
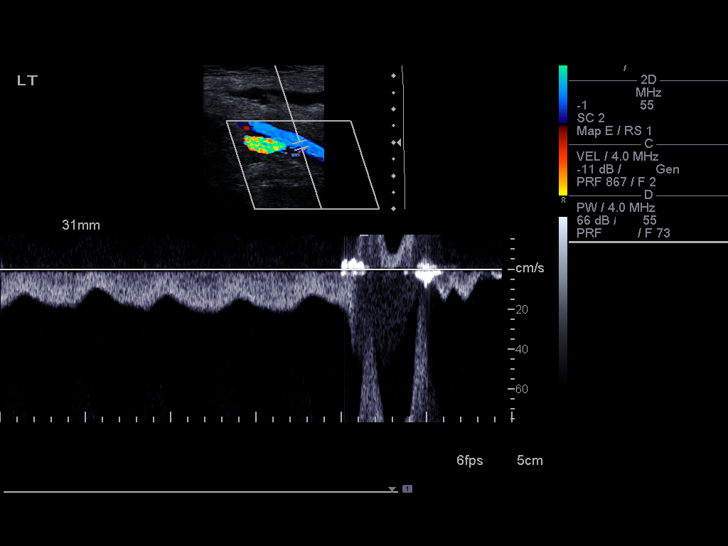
[im 18/32]
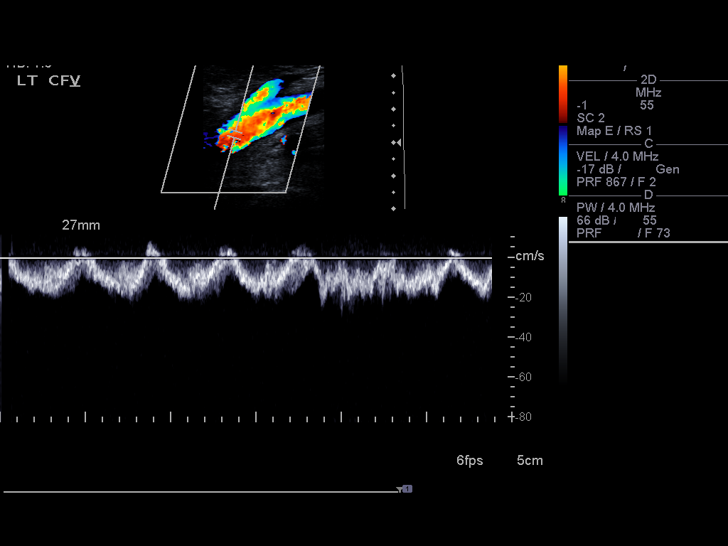
[im 21/32]
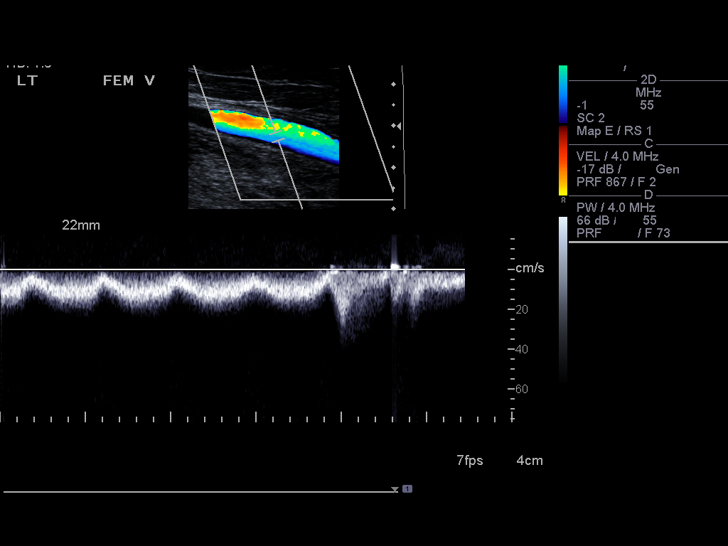
[im 23/32]
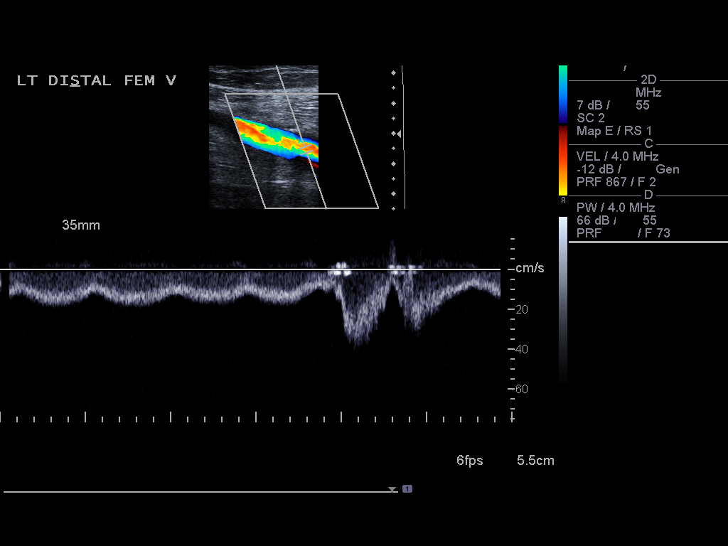
[im 26/32]
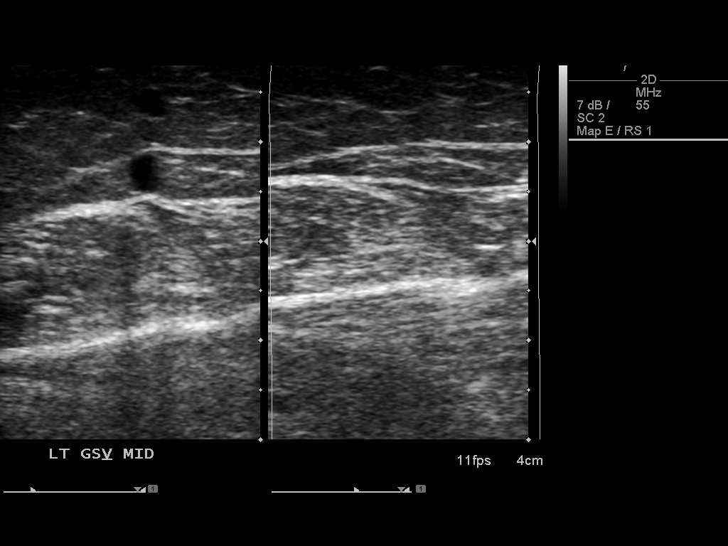
[im 29/32]
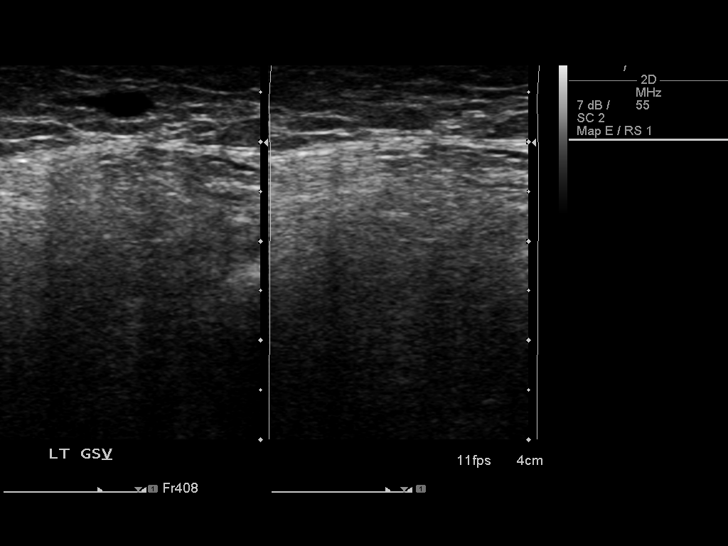
[im 32/32]
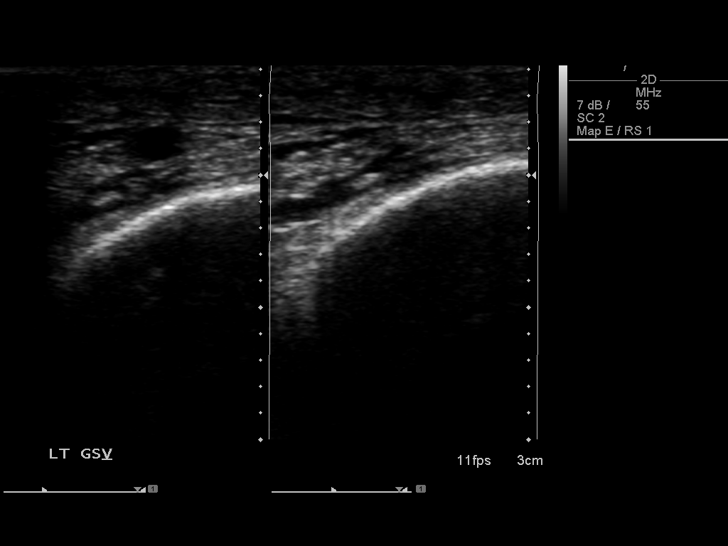

[13 of 24 positions shown; findings below may reference images not displayed]

FINDINGS: Contralateral Common Femoral Vein: Respiratory phasicity is normal
and symmetric with the symptomatic side. No evidence of thrombus.
Normal compressibility.

Common Femoral Vein: No evidence of thrombus. Normal
compressibility, respiratory phasicity and response to augmentation.

Saphenofemoral Junction: No evidence of thrombus. Normal
compressibility and flow on color Doppler imaging.

Profunda Femoral Vein: No evidence of thrombus. Normal
compressibility and flow on color Doppler imaging.

Femoral Vein: No evidence of thrombus. Normal compressibility,
respiratory phasicity and response to augmentation.

Popliteal Vein: No evidence of thrombus. Normal compressibility,
respiratory phasicity and response to augmentation.

Calf Veins: No evidence of thrombus. Normal compressibility and flow
on color Doppler imaging.

Superficial Great Saphenous Vein: No evidence of thrombus. Normal
compressibility and flow on color Doppler imaging.

Venous Reflux:  None.

Other Findings: A minimal amount of subcutaneous edema is noted at
the level of the left ankle.
IMPRESSION: No evidence of DVT within the left lower extremity.
